# Patient Record
Sex: Female | Born: 1990 | Race: Asian | Hispanic: No | Marital: Single | Smoking: Current some day smoker
Health system: Southern US, Community
[De-identification: ages and names within clinical notes are randomized; demographics above are authoritative.]

## PROBLEM LIST (undated history)

## (undated) HISTORY — PX: INDUCED ABORTION: SHX677

---

## 2011-07-07 ENCOUNTER — Emergency Department (HOSPITAL_COMMUNITY)
Admission: EM | Admit: 2011-07-07 | Discharge: 2011-07-07 | Payer: Self-pay | Attending: Emergency Medicine | Admitting: Emergency Medicine

## 2011-07-07 ENCOUNTER — Emergency Department (HOSPITAL_COMMUNITY): Payer: Self-pay

## 2011-07-07 DIAGNOSIS — R109 Unspecified abdominal pain: Secondary | ICD-10-CM | POA: Insufficient documentation

## 2011-07-07 DIAGNOSIS — M549 Dorsalgia, unspecified: Secondary | ICD-10-CM | POA: Insufficient documentation

## 2011-07-07 LAB — URINALYSIS, ROUTINE W REFLEX MICROSCOPIC
Bilirubin Urine: NEGATIVE
Glucose, UA: NEGATIVE mg/dL
Hgb urine dipstick: NEGATIVE
Ketones, ur: NEGATIVE mg/dL
Leukocytes, UA: NEGATIVE
Protein, ur: NEGATIVE mg/dL
pH: 6 (ref 5.0–8.0)

## 2011-07-07 LAB — POCT I-STAT, CHEM 8
BUN: 10 mg/dL (ref 6–23)
Calcium, Ion: 1.23 mmol/L (ref 1.12–1.32)
Chloride: 103 mEq/L (ref 96–112)
Creatinine, Ser: 0.9 mg/dL (ref 0.50–1.10)
Glucose, Bld: 87 mg/dL (ref 70–99)
Potassium: 3.9 mEq/L (ref 3.5–5.1)

## 2011-07-07 MED ORDER — HYDROCODONE-ACETAMINOPHEN 5-325 MG PO TABS
2.0000 | ORAL_TABLET | Freq: Once | ORAL | Status: DC
  Filled 2011-07-07: qty 2

## 2011-07-07 MED ORDER — IBUPROFEN 800 MG PO TABS
800.0000 mg | ORAL_TABLET | Freq: Once | ORAL | Status: AC
  Administered 2011-07-07: 800 mg via ORAL
  Filled 2011-07-07: qty 1

## 2011-07-07 NOTE — ED Provider Notes (Signed)
History     CSN: [id number] Arrival date & time: 07/07/2011  4:32 PM   First MD Initiated Contact with Patient 07/07/11 1825      Chief Complaint  Patient presents with  . Flank Pain    (Consider location/radiation/quality/duration/timing/severity/associated sxs/prior treatment) HPI Comments: The patient is a 20 year old female with a chief complaint of left-sided flank pain. She reports that the flank pain began acutely while she was driving her car at approximately 3 or 4 in the afternoon. The pain originates at the left flank, is sharp in character, and was severe in intensity at its onset, 10 out of 10 in pain, and radiates to the left lower abdomen. The patient's current level of pain is approximately 2-3/10 in intensity. She denies any nausea or vomiting, diarrhea, constipation, vaginal bleeding, vaginal discharge, dysuria, urinary urgency, urinary frequency, or hematuria. The patient denies any other complaints including shortness of breath or cough.  Patient is a 20 y.o. female presenting with flank pain. The history is provided by the patient.  Flank Pain This is a new problem. The current episode started 3 to 5 hours ago. The problem occurs constantly. The problem has been gradually improving. Associated symptoms include abdominal pain. Pertinent negatives include no chest pain, no headaches and no shortness of breath. The symptoms are aggravated by nothing. The symptoms are relieved by nothing. She has tried nothing for the symptoms.    History reviewed. No pertinent past medical history.  Past Surgical History  Procedure Date  . Induced abortion     History reviewed. No pertinent family history.  History  Substance Use Topics  . Smoking status: Current Some Day Smoker  . Smokeless tobacco: Not on file  . Alcohol Use: Yes    OB History    Grav Para Term Preterm Abortions TAB SAB Ect Mult Living                  Review of Systems  Constitutional: Negative for  fever, chills, diaphoresis, activity change, appetite change and fatigue.  HENT: Negative for sore throat, neck pain and neck stiffness.   Respiratory: Negative.  Negative for cough and shortness of breath.   Cardiovascular: Negative.  Negative for chest pain.  Gastrointestinal: Positive for abdominal pain. Negative for nausea, vomiting and abdominal distention.  Genitourinary: Positive for flank pain. Negative for dysuria, urgency, frequency, hematuria, decreased urine volume, vaginal bleeding, vaginal discharge, difficulty urinating, vaginal pain and pelvic pain.  Musculoskeletal: Positive for back pain. Negative for myalgias, joint swelling and gait problem.  Skin: Negative for color change and rash.  Neurological: Negative for weakness, numbness and headaches.  Hematological: Negative.   Psychiatric/Behavioral: Negative.     Allergies  Kiwi extract  Home Medications  No current outpatient prescriptions on file.  BP 116/72  Pulse 86  Temp(Src) 98 F (36.7 C) (Oral)  SpO2 99%  LMP 06/30/2011  Physical Exam  Nursing note and vitals reviewed. Constitutional: She is oriented to person, place, and time. She appears well-nourished. No distress.  HENT:  Head: Normocephalic and atraumatic.  Eyes: EOM are normal. Pupils are equal, round, and reactive to light.  Neck: Normal range of motion. Neck supple. No JVD present.  Cardiovascular: Normal rate, regular rhythm, normal heart sounds and intact distal pulses.  Exam reveals no gallop and no friction rub.   No murmur heard. Pulmonary/Chest: Effort normal and breath sounds normal. No respiratory distress. She has no wheezes. She has no rales. She exhibits no tenderness.  Abdominal:  Soft. Bowel sounds are normal. She exhibits no distension, no fluid wave, no ascites and no mass. There is tenderness. There is CVA tenderness. There is no rebound and no guarding.  Musculoskeletal: Normal range of motion. She exhibits no edema and no  tenderness.  Neurological: She is alert and oriented to person, place, and time. She has normal reflexes. No cranial nerve deficit. She exhibits normal muscle tone. Coordination normal.  Skin: Skin is warm and dry. No rash noted. She is not diaphoretic. No erythema. No pallor.  Psychiatric: She has a normal mood and affect. Her behavior is normal. Judgment and thought content normal.    ED Course  Procedures (including critical care time)   Labs Reviewed  URINALYSIS, ROUTINE W REFLEX MICROSCOPIC  POCT PREGNANCY, URINE  POCT PREGNANCY, URINE  I-STAT, CHEM 8   Ct Abdomen Pelvis Wo Contrast  07/07/2011  *RADIOLOGY REPORT*  Clinical Data:   left flank pain  CT ABDOMEN AND PELVIS WITHOUT CONTRAST  Technique:  Multidetector CT imaging of the abdomen and pelvis was performed following the standard protocol without intravenous contrast.  Comparison: None.  Findings: Negative for urinary tract calculi.  Negative for renal obstruction or mass.  Liver and gallbladder are normal.  Pancreas and spleen are normal. Negative for bowel obstruction. Appendix is normal.  No free fluid. No mass or adenopathy.  IMPRESSION: Negative  Original Report Authenticated By: Camelia Phenes, M.D.     No diagnosis found.    MDM  Differential diagnosis includes urinary tract infection, renal colic, ureteral stone, kidney stone, muscle strain. The history and physical exam are suggestive of kidney stone and as such I'll obtain a CT scan of her abdomen and pelvis without contrast to evaluate further. There is no apparent urinary tract infection or hematuria, however this does not rule out kidney stone.        Felisa Bonier, MD 07/07/11 2029

## 2011-07-07 NOTE — ED Notes (Signed)
Pt to ED for eval of left lower quadrant pain. Pt reports she was driving her car when she had sudden onset of sharp pain, denied any nausea or vomiting associated with it. Pt reports intermittent fevers at home but has not taken her temperature. Denies diarrhea. Pt reports tenderness with palpation to left flank and left lower abd. Pt appears in no distress. Denies vaginal discharge. Last menstrual period was last week.

## 2011-07-07 NOTE — ED Notes (Signed)
Patient presents with left flank pain radiating to LLQ abdomen, sudden onset since 1500 today. Patient denies urinary symptoms and vaginal bleeding.

## 2011-07-07 NOTE — ED Provider Notes (Signed)
Brigida Scotti S 8:00 PM  Patient discussed in sign out with Dr. Doylene Canard. Patient currently being worked up for possible kidney stone. UA without signs for UTI. She without vaginal complaints no discharge or bleeding. He should with good vital signs and is afebrile. CT scan unremarkable for kidney stone or other acute abdominal process.    Results for orders placed during the hospital encounter of 07/07/11  URINALYSIS, ROUTINE W REFLEX MICROSCOPIC      Component Value Range   Color, Urine YELLOW  YELLOW    Appearance CLEAR  CLEAR    Specific Gravity, Urine 1.029  1.005 - 1.030    pH 6.0  5.0 - 8.0    Glucose, UA NEGATIVE  NEGATIVE (mg/dL)   Hgb urine dipstick NEGATIVE  NEGATIVE    Bilirubin Urine NEGATIVE  NEGATIVE    Ketones, ur NEGATIVE  NEGATIVE (mg/dL)   Protein, ur NEGATIVE  NEGATIVE (mg/dL)   Urobilinogen, UA 0.2  0.0 - 1.0 (mg/dL)   Nitrite NEGATIVE  NEGATIVE    Leukocytes, UA NEGATIVE  NEGATIVE   POCT PREGNANCY, URINE      Component Value Range   Preg Test, Ur NEGATIVE    POCT I-STAT, CHEM 8      Component Value Range   Sodium 141  135 - 145 (mEq/L)   Potassium 3.9  3.5 - 5.1 (mEq/L)   Chloride 103  96 - 112 (mEq/L)   BUN 10  6 - 23 (mg/dL)   Creatinine, Ser 4.09  0.50 - 1.10 (mg/dL)   Glucose, Bld 87  70 - 99 (mg/dL)   Calcium, Ion 8.11  9.14 - 1.32 (mmol/L)   TCO2 28  0 - 100 (mmol/L)   Hemoglobin 14.3  12.0 - 15.0 (g/dL)   HCT 78.2  95.6 - 21.3 (%)   Ct Abdomen Pelvis Wo Contrast  07/07/2011  *RADIOLOGY REPORT*  Clinical Data:   left flank pain  CT ABDOMEN AND PELVIS WITHOUT CONTRAST  Technique:  Multidetector CT imaging of the abdomen and pelvis was performed following the standard protocol without intravenous contrast.  Comparison: None.  Findings: Negative for urinary tract calculi.  Negative for renal obstruction or mass.  Liver and gallbladder are normal.  Pancreas and spleen are normal. Negative for bowel obstruction. Appendix is normal.  No free fluid. No mass or  adenopathy.  IMPRESSION: Negative  Original Report Authenticated By: Camelia Phenes, M.D.      Angus Seller, [place] 07/07/11 2218

## 2011-07-09 NOTE — ED Provider Notes (Signed)
I saw this patient primarily, completing a history and physical examination prior to passing care of the patient to Fabio Pierce, PA under my supervision and with my collaboration.  Felisa Bonier, MD 07/09/11 1120

## 2013-12-29 ENCOUNTER — Ambulatory Visit: Payer: BC Managed Care – PPO | Admitting: Family Medicine

## 2013-12-29 VITALS — BP 100/60 | HR 65 | Temp 98.3°F | Ht 64.0 in | Wt 129.8 lb

## 2013-12-29 DIAGNOSIS — L259 Unspecified contact dermatitis, unspecified cause: Secondary | ICD-10-CM

## 2013-12-29 DIAGNOSIS — J309 Allergic rhinitis, unspecified: Secondary | ICD-10-CM

## 2013-12-29 LAB — POCT RAPID STREP A (OFFICE): RAPID STREP A SCREEN: NEGATIVE

## 2013-12-29 MED ORDER — PREDNISONE 20 MG PO TABS: ORAL_TABLET | ORAL | Status: DC

## 2013-12-29 MED ORDER — TRIAMCINOLONE 0.1 % CREAM:EUCERIN CREAM 1:1
1.0000 | TOPICAL_CREAM | Freq: Two times a day (BID) | CUTANEOUS | Status: DC
Start: 2013-12-29 — End: 2014-01-31

## 2013-12-29 NOTE — Patient Instructions (Signed)
1. Continue Zyrtec 10mg  one tablet daily. 2. Take Benadryl 25mg  one tablet at bedtime. 3. Continue Flonase daily.  Contact Dermatitis Contact dermatitis is a reaction to certain substances that touch the skin. Contact dermatitis can be either irritant contact dermatitis or allergic contact dermatitis. Irritant contact dermatitis does not require previous exposure to the substance for a reaction to occur.Allergic contact dermatitis only occurs if you have been exposed to the substance before. Upon a repeat exposure, your body reacts to the substance.  CAUSES  Many substances can cause contact dermatitis. Irritant dermatitis is most commonly caused by repeated exposure to mildly irritating substances, such as:  Makeup.  Soaps.  Detergents.  Bleaches.  Acids.  Metal salts, such as nickel. Allergic contact dermatitis is most commonly caused by exposure to:  Poisonous plants.  Chemicals (deodorants, shampoos).  Jewelry.  Latex.  Neomycin in triple antibiotic cream.  Preservatives in products, including clothing. SYMPTOMS  The area of skin that is exposed may develop:  Dryness or flaking.  Redness.  Cracks.  Itching.  Pain or a burning sensation.  Blisters. With allergic contact dermatitis, there may also be swelling in areas such as the eyelids, mouth, or genitals.  DIAGNOSIS  Your caregiver can usually tell what the problem is by doing a physical exam. In cases where the cause is uncertain and an allergic contact dermatitis is suspected, a patch skin test may be performed to help determine the cause of your dermatitis. TREATMENT Treatment includes protecting the skin from further contact with the irritating substance by avoiding that substance if possible. Barrier creams, powders, and gloves may be helpful. Your caregiver may also recommend:  Steroid creams or ointments applied 2 times daily. For best results, soak the rash area in cool water for 20 minutes. Then  apply the medicine. Cover the area with a plastic wrap. You can store the steroid cream in the refrigerator for a "chilly" effect on your rash. That may decrease itching. Oral steroid medicines may be needed in more severe cases.  Antibiotics or antibacterial ointments if a skin infection is present.  Antihistamine lotion or an antihistamine taken by mouth to ease itching.  Lubricants to keep moisture in your skin.  Burow's solution to reduce redness and soreness or to dry a weeping rash. Mix one packet or tablet of solution in 2 cups cool water. Dip a clean washcloth in the mixture, wring it out a bit, and put it on the affected area. Leave the cloth in place for 30 minutes. Do this as often as possible throughout the day.  Taking several cornstarch or baking soda baths daily if the area is too large to cover with a washcloth. Harsh chemicals, such as alkalis or acids, can cause skin damage that is like a burn. You should flush your skin for 15 to 20 minutes with cold water after such an exposure. You should also seek immediate medical care after exposure. Bandages (dressings), antibiotics, and pain medicine may be needed for severely irritated skin.  HOME CARE INSTRUCTIONS  Avoid the substance that caused your reaction.  Keep the area of skin that is affected away from hot water, soap, sunlight, chemicals, acidic substances, or anything else that would irritate your skin.  Do not scratch the rash. Scratching may cause the rash to become infected.  You may take cool baths to help stop the itching.  Only take over-the-counter or prescription medicines as directed by your caregiver.  See your caregiver for follow-up care as directed to  make sure your skin is healing properly. SEEK MEDICAL CARE IF:   Your condition is not better after 3 days of treatment.  You seem to be getting worse.  You see signs of infection such as swelling, tenderness, redness, soreness, or warmth in the affected  area.  You have any problems related to your medicines. Document Released: 08/08/2000 Document Revised: 11/03/2011 Document Reviewed: 01/14/2011 [facility] Patient Information 2014 [place], [place].

## 2013-12-29 NOTE — Progress Notes (Signed)
Subjective:    Patient ID: Alicia Knight, female    DOB: 06-Jun-1991, 23 y.o.   MRN: [id number]  12/29/2013  Rash This chart was scribed for Ethelda Chick, MD by Charline Bills, ED Scribe. The patient was seen in room 13. Patient's care was started at 2:58 PM.   HPI HPI Comments: Alicia Knight is a 23 y.o. female who presents to the [facility] complaining of rash onset 2-3 days ago that is gradually spreading. She describes the rash as itchy and reports the rash on her neck, chest and stomach. She denies fever, chills, sweats, HA, SOB, mouth sores, swelling and sore throat. Pt has a h/o of similar rash but states that it did not spread. Pt denies use of any new detergents. She states that she has always used [employer], Jergen's lotion, and Tide and Bounce for detergents. She denies any new shampoo and foods. Pt also states that she does not wear jewelry; allergy to costume jewelry. Pt also denies the use of tanning beds. She has tried anti-fungal cream and alcohol wipes with temporary relief.   Pt reports seasonal allergies. She takes Zyrtec daily and Flonase every other day with mild relief.   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  Review of Systems  Constitutional: Negative for fever, chills and diaphoresis.  HENT: Positive for congestion, rhinorrhea, sinus pressure and sneezing. Negative for ear pain, facial swelling, mouth sores, sore throat, trouble swallowing and voice change.   Respiratory: Negative for shortness of breath, wheezing and stridor.   Skin: Positive for rash.  Allergic/Immunologic: Positive for environmental allergies.  Neurological: Negative for headaches.    History reviewed. No pertinent past medical history. Allergies  Allergen Reactions  . Kiwi Extract Anaphylaxis   Current Outpatient Prescriptions  Medication Sig Dispense Refill  . predniSONE  (DELTASONE) 20 MG tablet Two tablets daily x 5 days then one tablet daily x 5 days  15 tablet  0  . Triamcinolone Acetonide (TRIAMCINOLONE 0.1 % CREAM : EUCERIN) CREA Apply 1 application topically 2 (two) times daily.  1 each  0   No current facility-administered medications for this visit.   History   Social History  . Marital Status: Single    Spouse Name: N/A    Number of Children: N/A  . Years of Education: N/A   Occupational History  . Not on file.   Social History Main Topics  . Smoking status: Current Some Day Smoker  . Smokeless tobacco: Not on file  . Alcohol Use: Yes  . Drug Use: No  . Sexual Activity: Yes   Other Topics Concern  . Not on file   Social History Narrative  . No narrative on file       Objective:    Triage Vitals: BP 100/60  Pulse 65  Temp(Src) 98.3 F (36.8 C)  Ht 5\' 4"  (1.626 m)  Wt 129 lb 12.8 oz (58.877 kg)  BMI 22.27 kg/m2  SpO2 98%  LMP 12/07/2013 Physical Exam  Nursing note and vitals reviewed. Constitutional: She is oriented to person, place, and time. She appears well-developed and well-nourished. No distress.  HENT:  Head: Normocephalic and atraumatic.  Right Ear: External ear normal.  Left Ear: External ear normal.  Mouth/Throat: Posterior oropharyngeal erythema present. No oropharyngeal exudate.  Eyes: Conjunctivae and EOM are normal. Pupils are equal, round, and reactive to light.  Neck: Normal range of motion. Neck supple. No  thyromegaly present.  Cardiovascular: Normal rate, regular rhythm and normal heart sounds.   No murmur heard. Pulmonary/Chest: Effort normal and breath sounds normal. No respiratory distress. She has no wheezes. She has no rales.  Musculoskeletal: Normal range of motion.  Lymphadenopathy:    She has no cervical adenopathy.  Neurological: She is alert and oriented to person, place, and time.  Skin: Skin is warm and dry. Rash noted. She is not diaphoretic. There is erythema.  Erythematous macular and  papular rash on chest that extends to neck  Non-branching particular rash on R neck  Psychiatric: She has a normal mood and affect. Her behavior is normal.   Results for orders placed in visit on 12/29/13  POCT RAPID STREP A (OFFICE)      Result Value Ref Range   Rapid Strep A Screen Negative  Negative       Assessment & Plan:   1. Contact dermatitis   2. Allergic rhinitis, cause unspecified    1. Contact dermatitis:  New. Rx for Prednisone and triamcinolone-eucerin cream to use bid. Continue Zyrtec daily; add Benadryl 25mg  qhs.  RTC for worsening rash, SOB, facial swelling.  Obtain throat culture. 2.  Allergic Rhinitis: uncontrolled; continue Zyrtec 10mg  daily; add Benadryl qhs; increase Flonase to daily; rx for Prednisone provided.  Meds ordered this encounter  Medications  . predniSONE (DELTASONE) 20 MG tablet    Sig: Two tablets daily x 5 days then one tablet daily x 5 days    Dispense:  15 tablet    Refill:  0  . Triamcinolone Acetonide (TRIAMCINOLONE 0.1 % CREAM : EUCERIN) CREA    Sig: Apply 1 application topically 2 (two) times daily.    Dispense:  1 each    Refill:  0    No Follow-up on file.   I personally performed the services described in this documentation, which was scribed in my presence.  The recorded information has been reviewed and is accurate.  Nilda Simmer, M.D.  [facility] [address] [place], [place]  [address] [phone] phone [phone] fax

## 2013-12-30 ENCOUNTER — Telehealth: Payer: Self-pay

## 2013-12-30 NOTE — Telephone Encounter (Signed)
Called pharm. Needed a quantity of the traim/eucerin. They typically give 4 oz. Told them that was ok

## 2013-12-30 NOTE — Telephone Encounter (Signed)
[facility] called in regards to prescription prescribed to pt by Dr.Smith, said it had unspecified amount, need to know how much to give to pt. Please call and speak with either Hedi or susan. [phone]

## 2013-12-31 LAB — CULTURE, GROUP A STREP: ORGANISM ID, BACTERIA: NORMAL

## 2014-01-10 ENCOUNTER — Ambulatory Visit: Payer: BC Managed Care – PPO | Admitting: Physician Assistant

## 2014-01-10 VITALS — BP 100/60 | HR 81 | Temp 98.4°F | Resp 16 | Ht 64.0 in | Wt 132.0 lb

## 2014-01-10 DIAGNOSIS — J309 Allergic rhinitis, unspecified: Secondary | ICD-10-CM

## 2014-01-10 DIAGNOSIS — R05 Cough: Secondary | ICD-10-CM

## 2014-01-10 DIAGNOSIS — J988 Other specified respiratory disorders: Secondary | ICD-10-CM

## 2014-01-10 DIAGNOSIS — J22 Unspecified acute lower respiratory infection: Secondary | ICD-10-CM

## 2014-01-10 DIAGNOSIS — R059 Cough, unspecified: Secondary | ICD-10-CM

## 2014-01-10 MED ORDER — AZITHROMYCIN 250 MG PO TABS: ORAL_TABLET | ORAL | Status: DC

## 2014-01-10 MED ORDER — PREDNISONE 20 MG PO TABS: ORAL_TABLET | ORAL | Status: DC

## 2014-01-10 MED ORDER — HYDROCODONE-HOMATROPINE 5-1.5 MG/5ML PO SYRP: 5.0000 mL | ORAL_SOLUTION | Freq: Three times a day (TID) | ORAL | Status: DC | PRN

## 2014-01-10 NOTE — Progress Notes (Signed)
   Subjective:    Patient ID: Alicia Knight, female    DOB: 10-10-1990, 23 y.o.   MRN: [id number]  HPI 23 year old female presents for evaluation of 3 day history of worsening cough, PND, SOB, and sore throat. Has had chills and subjective fever but no documented fever.  Has minimal nasal congestion but has had a lot of mucous production in her throat. Admits to sore throat and SOB while coughing. Denies wheezing, hemoptysis, or chest pain.  Takes Zyrtec year round for allergies. Has taken 1 dose of Nyquil which she does think has helped.  No otalgia, sinus pain, nausea, vomiting, headache, or dizziness.  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     Review of Systems  Constitutional: Positive for fever (subjective) and chills.  HENT: Positive for congestion, postnasal drip, rhinorrhea and sore throat.   Respiratory: Positive for cough and shortness of breath. Negative for chest tightness and wheezing.   Cardiovascular: Negative for chest pain.  Gastrointestinal: Negative for nausea and vomiting.  Neurological: Negative for dizziness and headaches.       Objective:   Physical Exam  Constitutional: She is oriented to person, place, and time. She appears well-developed and well-nourished.  HENT:  Head: Normocephalic and atraumatic.  Right Ear: Hearing, tympanic membrane, external ear and ear canal normal.  Left Ear: Hearing, tympanic membrane, external ear and ear canal normal.  Mouth/Throat: Uvula is midline and mucous membranes are normal. Posterior oropharyngeal erythema present. No oropharyngeal exudate, posterior oropharyngeal edema or tonsillar abscesses.  Eyes: Conjunctivae are normal.  Neck: Normal range of motion. Neck supple.  Cardiovascular: Normal rate, regular rhythm and normal heart sounds.   Pulmonary/Chest: Effort normal and breath sounds normal.  Lymphadenopathy:    She has  no cervical adenopathy.  Neurological: She is alert and oriented to person, place, and time.  Psychiatric: She has a normal mood and affect. Her behavior is normal. Judgment and thought content normal.          Assessment & Plan:  Lower respiratory infection (e.g., bronchitis, pneumonia, pneumonitis, pulmonitis) - Plan: azithromycin (ZITHROMAX) 250 MG tablet  Cough - Plan: HYDROcodone-homatropine (HYCODAN) 5-1.5 MG/5ML syrup  Allergic rhinitis - Plan: predniSONE (DELTASONE) 20 MG tablet  Will go ahead and treat with Zpack as directed Prednisone taper to help with allergic symptoms and SOB.  Hycodan qhs prn cough - caution sedation Push fluids Follow up if symptoms or fail to improve.

## 2014-01-31 ENCOUNTER — Ambulatory Visit (INDEPENDENT_AMBULATORY_CARE_PROVIDER_SITE_OTHER): Payer: BC Managed Care – PPO | Admitting: Family Medicine

## 2014-01-31 ENCOUNTER — Ambulatory Visit: Payer: BC Managed Care – PPO

## 2014-01-31 VITALS — BP 100/62 | HR 66 | Temp 98.1°F | Resp 16 | Ht 64.0 in | Wt 131.4 lb

## 2014-01-31 DIAGNOSIS — R091 Pleurisy: Secondary | ICD-10-CM

## 2014-01-31 DIAGNOSIS — R079 Chest pain, unspecified: Secondary | ICD-10-CM

## 2014-01-31 DIAGNOSIS — J209 Acute bronchitis, unspecified: Secondary | ICD-10-CM

## 2014-01-31 LAB — POCT CBC
GRANULOCYTE PERCENT: 56.7 % (ref 37–80)
HEMATOCRIT: 38.5 % (ref 37.7–47.9)
HEMOGLOBIN: 12 g/dL — AB (ref 12.2–16.2)
Lymph, poc: 1.7 (ref 0.6–3.4)
MCH, POC: 27.6 pg (ref 27–31.2)
MCHC: 31.2 g/dL — AB (ref 31.8–35.4)
MCV: 88.5 fL (ref 80–97)
MID (cbc): 0.4 (ref 0–0.9)
MPV: 10.7 fL (ref 0–99.8)
POC GRANULOCYTE: 2.8 (ref 2–6.9)
POC LYMPH %: 34.9 % (ref 10–50)
POC MID %: 8.4 %M (ref 0–12)
Platelet Count, POC: 230 10*3/uL (ref 142–424)
RBC: 4.35 M/uL (ref 4.04–5.48)
RDW, POC: 14.4 %
WBC: 4.9 10*3/uL (ref 4.6–10.2)

## 2014-01-31 LAB — POCT SEDIMENTATION RATE: POCT SED RATE: 30 mm/h — AB (ref 0–22)

## 2014-01-31 MED ORDER — PREDNISONE 20 MG PO TABS: 40.0000 mg | ORAL_TABLET | Freq: Every day | ORAL | Status: AC

## 2014-01-31 MED ORDER — ALBUTEROL SULFATE HFA 108 (90 BASE) MCG/ACT IN AERS: 2.0000 | INHALATION_SPRAY | RESPIRATORY_TRACT | Status: AC | PRN

## 2014-01-31 NOTE — Progress Notes (Addendum)
Subjective:  This chart was scribed for Alicia Sorenson, MD, by Yevette Edwards, ED Scribe. This patient's care was started at 5:15 PM.   Patient ID: Alicia Knight, female    DOB: 1991/08/10, 23 y.o.   MRN: [id number]  Chief Complaint  Patient presents with  . Shortness of Breath    x 1 week    Shortness of Breath Associated symptoms include chest pain and wheezing. Pertinent negatives include no abdominal pain, ear pain, fever, leg swelling, sore throat or vomiting.   HPI Comments: Alicia Knight - "Alicia Knight" is a 23 y.o. female who presents to [facility] complaining of left-sided upper chest pain which has occurred intermittently for a week. She characterizes the pain as "crunching, like something is twisting in my chest". 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 She states the chest pain is increased with pulling and pushing movements as well as with coughing.  Additionally, she endorses SOB, expiratory wheezing, and palpitations during the CP. The SOB and chest pain are often associated together, but they can occur separately. The pt states that the SOB and chest pain resolve within seconds once she rests. Occassionally she experiences lightheadedness, dizziness, hot sweats, and chills. She denies nausea or emesis associated with the chest pain; she also denies radiation of the chest pain. She also denies anxiety. Additionally, the pt denies swelling to her extremities, recent surgeries, recent flights, recent bedrest, and use of oral contraceptives.  She does not smoke.  Alicia Knight denies a h/o asthma, though she reports her older sister has asthma. She was not involved in organized sports in school and does not exercise. She report an illness two weeks ago which involved congestion and a cough productive of clear phlegm; she was seen here and diagnosed w/ bronchitis.  The pt was prescribed prednisone 40 mg X 3 days then 20mg  x 3d  and Z pack. Her CP and SHoB symptoms developed after she finished the medications.  She continues to experience the cough and congestion from her recent illness intermittently.   No past medical history on file.  Current Outpatient Prescriptions on File Prior to Visit  Medication Sig Dispense Refill  . cetirizine (ZYRTEC) 10 MG tablet Take 10 mg by mouth daily.       No current facility-administered medications on file prior to visit.   Allergies  Allergen Reactions  . Kiwi Extract Anaphylaxis    Review of Systems  Constitutional: Positive for chills and diaphoresis. Negative for fever, activity change, appetite change, fatigue and unexpected weight change.  HENT: Positive for congestion. Negative for ear discharge, ear pain, sinus pressure, sore throat, tinnitus, trouble swallowing and voice change.   Respiratory: Positive for cough, chest tightness, shortness of breath and wheezing. Negative for apnea and stridor.   Cardiovascular: Positive for chest pain and palpitations. Negative for leg swelling.  Gastrointestinal: Negative for nausea, vomiting and abdominal pain.  Genitourinary: Negative for dysuria, urgency and decreased urine volume.  Musculoskeletal: Negative for back pain, gait problem and joint swelling.  Skin: Negative for color change and pallor.  Neurological: Positive for dizziness and light-headedness. Negative for tremors, syncope and weakness.  Hematological: Negative for adenopathy. Does not bruise/bleed easily.  Psychiatric/Behavioral: Negative for sleep disturbance. The patient is not nervous/anxious.     Triage Vitals: BP 100/62  Pulse 66  Temp(Src) 98.1 F (36.7 C) (Oral)  Resp 16  Ht 5\' 4"  (1.626 m)  Wt 131 lb 6.4  oz (59.603 kg)  BMI 22.54 kg/m2  SpO2 100%  LMP 01/10/2014    Objective:   Physical Exam  Nursing note and vitals reviewed. Constitutional: She is oriented to person, place, and time. She appears well-developed and well-nourished. No  distress.  HENT:  Head: Normocephalic and atraumatic.  Right Ear: Tympanic membrane and external ear normal. Tympanic membrane is not injected, not erythematous, not retracted and not bulging.  Left Ear: Tympanic membrane and external ear normal. Tympanic membrane is not injected, not erythematous, not retracted and not bulging.  Nose: Mucosal edema present.  Mouth/Throat: Oropharynx is clear and moist. No oropharyngeal exudate, posterior oropharyngeal edema or posterior oropharyngeal erythema.  Eyes: Conjunctivae and EOM are normal.  Neck: Normal range of motion. No tracheal deviation present.  Cardiovascular: Normal rate, regular rhythm, S1 normal, S2 normal and normal heart sounds.   No murmur heard. Pulmonary/Chest: Effort normal. No respiratory distress. She has no decreased breath sounds. She has no wheezes. She has no rhonchi. She has no rales. She exhibits tenderness.    ttp over left upper costo-sternal junction corresponding with  area of perceived pain  Musculoskeletal: Normal range of motion.  Lymphadenopathy:       Head (right side): No submental, no submandibular, no preauricular and no posterior auricular adenopathy present.       Head (left side): No submental, no submandibular, no preauricular and no posterior auricular adenopathy present.    She has no cervical adenopathy.  Neurological: She is alert and oriented to person, place, and time.  Skin: Skin is warm and dry.  Psychiatric: She has a normal mood and affect. Her behavior is normal.       Results for orders placed in visit on 01/31/14  POCT CBC      Result Value Ref Range   WBC 4.9  4.6 - 10.2 K/uL   Lymph, poc 1.7  0.6 - 3.4   POC LYMPH PERCENT 34.9  10 - 50 %L   MID (cbc) 0.4  0 - 0.9   POC MID % 8.4  0 - 12 %M   POC Granulocyte 2.8  2 - 6.9   Granulocyte percent 56.7  37 - 80 %G   RBC 4.35  4.04 - 5.48 M/uL   Hemoglobin 12.0 (*) 12.2 - 16.2 g/dL   HCT, POC 60.438.5  54.037.7 - 47.9 %   MCV 88.5  80 - 97 fL     MCH, POC 27.6  27 - 31.2 pg   MCHC 31.2 (*) 31.8 - 35.4 g/dL   RDW, POC 98.114.4     Platelet Count, POC 230  142 - 424 K/uL   MPV 10.7  0 - 99.8 fL  POCT SEDIMENTATION RATE      Result Value Ref Range   POCT SED RATE 30 (*) 0 - 22 mm/hr    [facility] reading (PRIMARY) by  Dr. Clelia CroftShaw. CXR: normal EKG: NSR, no ischemic changes  EXAM: CHEST 2 VIEW  COMPARISON: None.  FINDINGS: The heart size and mediastinal contours are within normal limits. Both lungs are clear. The visualized skeletal structures are unremarkable.  IMPRESSION: No active cardiopulmonary disease.    Peak flow 350 Assessment & Plan:   Chest pain - Plan: EKG 12-Lead, POCT CBC, POCT SEDIMENTATION RATE, DG Chest 2 View  Pleuritis - cauing left-sided chest pain - prednisone - then switch over to otc nsaids when course is gone. RTC if continues or worsens.  Acute bronchitis - I suspect she may have  developed some airway reactivity during her bronchitis recovery - try using inhaler every 4 hours while working for prevention - should hopefully resolve over the next sev wks  Meds ordered this encounter  Medications  . albuterol (PROVENTIL HFA;VENTOLIN HFA) 108 (90 BASE) MCG/ACT inhaler    Sig: Inhale 2 puffs into the lungs every 4 (four) hours as needed for wheezing or shortness of breath (cough, shortness of breath or wheezing.).    Dispense:  1 Inhaler    Refill:  1  . predniSONE (DELTASONE) 20 MG tablet    Sig: Take 2 tablets (40 mg total) by mouth daily with breakfast.    Dispense:  10 tablet    Refill:  0    I personally performed the services described in this documentation, which was scribed in my presence. The recorded information has been reviewed and considered, and addended by me as needed.  Alicia Sorenson, MD MPH

## 2014-01-31 NOTE — Patient Instructions (Signed)
Pleurisy Pleurisy is an inflammation and swelling of the lining of the lungs (pleura). Because of this inflammation, it hurts to breathe. It can be aggravated by coughing, laughing, or deep breathing. Pleurisy is often caused by an underlying infection or disease.  HOME CARE INSTRUCTIONS  Monitor your pleurisy for any changes. The following actions may help to alleviate any discomfort you are experiencing:  Medicine may help with pain. Only take over-the-counter or prescription medicines for pain, discomfort, or fever as directed by your health care provider.  Only take antibiotic medicine as directed. Make sure to finish it even if you start to feel better. SEEK MEDICAL CARE IF:   Your pain is not controlled with medicine or is increasing.  You have an increase in pus-like (purulent) secretions brought up with coughing. SEEK IMMEDIATE MEDICAL CARE IF:   You have blue or dark lips, fingernails, or toenails.  You are coughing up blood.  You have increased difficulty breathing.  You have continuing pain unrelieved by medicine or pain lasting more than 1 week.  You have pain that radiates into your neck, arms, or jaw.  You develop increased shortness of breath or wheezing.  You develop a fever, rash, vomiting, fainting, or other serious symptoms. MAKE SURE YOU:  Understand these instructions.   Will watch your condition.   Will get help right away if you are not doing well or get worse.  Document Released: 08/11/2005 Document Revised: 04/13/2013 Document Reviewed: 01/23/2013 [facility] Patient Information 2014 [place], [place]. Acute Bronchitis Bronchitis is inflammation of the airways that extend from the windpipe into the lungs (bronchi). The inflammation often causes mucus to develop. This leads to a cough, which is the most common symptom of bronchitis.  In acute bronchitis, the condition usually develops suddenly and goes away over time, usually in a couple weeks. Smoking,  allergies, and asthma can make bronchitis worse. Repeated episodes of bronchitis may cause further lung problems.  CAUSES Acute bronchitis is most often caused by the same virus that causes a cold. The virus can spread from person to person (contagious).  SIGNS AND SYMPTOMS   Cough.   Fever.   Coughing up mucus.   Body aches.   Chest congestion.   Chills.   Shortness of breath.   Sore throat.  DIAGNOSIS  Acute bronchitis is usually diagnosed through a physical exam. Tests, such as chest X-rays, are sometimes done to rule out other conditions.  TREATMENT  Acute bronchitis usually goes away in a couple weeks. Often times, no medical treatment is necessary. Medicines are sometimes given for relief of fever or cough. Antibiotics are usually not needed but may be prescribed in certain situations. In some cases, an inhaler may be recommended to help reduce shortness of breath and control the cough. A cool mist vaporizer may also be used to help thin bronchial secretions and make it easier to clear the chest.  HOME CARE INSTRUCTIONS  Get plenty of rest.   Drink enough fluids to keep your urine clear or pale yellow (unless you have a medical condition that requires fluid restriction). Increasing fluids may help thin your secretions and will prevent dehydration.   Only take over-the-counter or prescription medicines as directed by your health care provider.   Avoid smoking and secondhand smoke. Exposure to cigarette smoke or irritating chemicals will make bronchitis worse. If you are a smoker, consider using nicotine gum or skin patches to help control withdrawal symptoms. Quitting smoking will help your lungs heal faster.  Reduce the chances of another bout of acute bronchitis by washing your hands frequently, avoiding people with cold symptoms, and trying not to touch your hands to your mouth, nose, or eyes.   Follow up with your health care provider as directed.  SEEK  MEDICAL CARE IF: Your symptoms do not improve after 1 week of treatment.  SEEK IMMEDIATE MEDICAL CARE IF:  You develop an increased fever or chills.   You have chest pain.   You have severe shortness of breath.  You have bloody sputum.   You develop dehydration.  You develop fainting.  You develop repeated vomiting.  You develop a severe headache. MAKE SURE YOU:   Understand these instructions.  Will watch your condition.  Will get help right away if you are not doing well or get worse. Document Released: 09/18/2004 Document Revised: 04/13/2013 Document Reviewed: 02/01/2013 [facility] Patient Information 2014 [place], [place].

## 2021-01-05 ENCOUNTER — Other Ambulatory Visit: Payer: Self-pay

## 2021-01-05 ENCOUNTER — Encounter (HOSPITAL_COMMUNITY): Payer: Self-pay | Admitting: Emergency Medicine

## 2021-01-05 ENCOUNTER — Emergency Department (HOSPITAL_COMMUNITY)
Admission: EM | Admit: 2021-01-05 | Discharge: 2021-01-06 | Disposition: A | Discharge status: Home / Self Care | Payer: Medicaid Other | Attending: Emergency Medicine | Admitting: Emergency Medicine

## 2021-01-05 DIAGNOSIS — R0781 Pleurodynia: Secondary | ICD-10-CM | POA: Diagnosis not present

## 2021-01-05 DIAGNOSIS — S20212A Contusion of left front wall of thorax, initial encounter: Secondary | ICD-10-CM | POA: Insufficient documentation

## 2021-01-05 DIAGNOSIS — Z79899 Other long term (current) drug therapy: Secondary | ICD-10-CM | POA: Diagnosis not present

## 2021-01-05 DIAGNOSIS — M546 Pain in thoracic spine: Secondary | ICD-10-CM | POA: Diagnosis not present

## 2021-01-05 DIAGNOSIS — F17200 Nicotine dependence, unspecified, uncomplicated: Secondary | ICD-10-CM | POA: Diagnosis not present

## 2021-01-05 DIAGNOSIS — S299XXA Unspecified injury of thorax, initial encounter: Secondary | ICD-10-CM | POA: Diagnosis present

## 2021-01-05 DIAGNOSIS — M545 Low back pain, unspecified: Secondary | ICD-10-CM | POA: Diagnosis not present

## 2021-01-05 NOTE — ED Notes (Signed)
Pt is tearful and reports that she feels nauseated.
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
## 2021-01-06 ENCOUNTER — Emergency Department (HOSPITAL_COMMUNITY): Payer: Medicaid Other

## 2021-01-06 LAB — PREGNANCY, URINE: Preg Test, Ur: NEGATIVE

## 2021-01-06 MED ORDER — IBUPROFEN 800 MG PO TABS
800.0000 mg | ORAL_TABLET | Freq: Once | ORAL | Status: AC
  Administered 2021-01-06: 800 mg via ORAL
  Filled 2021-01-06: qty 1

## 2021-01-06 MED ORDER — METHOCARBAMOL 500 MG PO TABS: 500.0000 mg | ORAL_TABLET | Freq: Two times a day (BID) | ORAL | 0 refills | Status: AC

## 2021-01-06 MED ORDER — NAPROXEN 500 MG PO TABS: 500.0000 mg | ORAL_TABLET | Freq: Two times a day (BID) | ORAL | 0 refills | Status: AC

## 2021-01-06 NOTE — Discharge Instructions (Addendum)
1. Medications: robaxin, naproxyn, vicodin, usual home medications 2. Treatment: rest, drink plenty of fluids, gentle stretching as discussed, alternate ice and heat 3. Follow Up: Please followup with your primary doctor in 3 days for discussion of your diagnoses and further evaluation after today's visit; if you do not have a primary care doctor use the resource guide provided to find one;  Return to the ER for worsening back pain, difficulty walking, loss of bowel or bladder control or other concerning symptoms    
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
